# Patient Record
Sex: Male | Born: 1995 | Race: Black or African American | Hispanic: No | Marital: Single | Smoking: Never smoker
Health system: Southern US, Community
[De-identification: ages and names within clinical notes are randomized; demographics above are authoritative.]

---
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
## 2020-08-13 ENCOUNTER — Emergency Department (HOSPITAL_BASED_OUTPATIENT_CLINIC_OR_DEPARTMENT_OTHER)
Admission: EM | Admit: 2020-08-13 | Discharge: 2020-08-13 | Disposition: A | Discharge status: Home / Self Care | Payer: Medicaid Other | Attending: Emergency Medicine | Admitting: Emergency Medicine

## 2020-08-13 ENCOUNTER — Encounter (HOSPITAL_BASED_OUTPATIENT_CLINIC_OR_DEPARTMENT_OTHER): Payer: Self-pay | Admitting: *Deleted

## 2020-08-13 ENCOUNTER — Other Ambulatory Visit: Payer: Self-pay

## 2020-08-13 ENCOUNTER — Emergency Department (HOSPITAL_BASED_OUTPATIENT_CLINIC_OR_DEPARTMENT_OTHER): Payer: Medicaid Other

## 2020-08-13 DIAGNOSIS — R0989 Other specified symptoms and signs involving the circulatory and respiratory systems: Secondary | ICD-10-CM | POA: Insufficient documentation

## 2020-08-13 DIAGNOSIS — R0602 Shortness of breath: Secondary | ICD-10-CM | POA: Insufficient documentation

## 2020-08-13 LAB — CBC WITH DIFFERENTIAL/PLATELET
Abs Immature Granulocytes: 0.01 10*3/uL (ref 0.00–0.07)
Basophils Absolute: 0 10*3/uL (ref 0.0–0.1)
Basophils Relative: 0 %
Eosinophils Absolute: 0 10*3/uL (ref 0.0–0.5)
Eosinophils Relative: 0 %
HCT: 42.9 % (ref 39.0–52.0)
Hemoglobin: 14.8 g/dL (ref 13.0–17.0)
Immature Granulocytes: 0 %
Lymphocytes Relative: 18 %
Lymphs Abs: 1.5 10*3/uL (ref 0.7–4.0)
MCH: 32.7 pg (ref 26.0–34.0)
MCHC: 34.5 g/dL (ref 30.0–36.0)
MCV: 94.7 fL (ref 80.0–100.0)
Monocytes Absolute: 0.7 10*3/uL (ref 0.1–1.0)
Monocytes Relative: 8 %
Neutro Abs: 6.2 10*3/uL (ref 1.7–7.7)
Neutrophils Relative %: 74 %
Platelets: 282 10*3/uL (ref 150–400)
RBC: 4.53 MIL/uL (ref 4.22–5.81)
RDW: 12.2 % (ref 11.5–15.5)
WBC: 8.4 10*3/uL (ref 4.0–10.5)
nRBC: 0 % (ref 0.0–0.2)

## 2020-08-13 LAB — COMPREHENSIVE METABOLIC PANEL
ALT: 38 U/L (ref 0–44)
AST: 33 U/L (ref 15–41)
Albumin: 4.7 g/dL (ref 3.5–5.0)
Alkaline Phosphatase: 49 U/L (ref 38–126)
Anion gap: 13 (ref 5–15)
BUN: 10 mg/dL (ref 6–20)
CO2: 22 mmol/L (ref 22–32)
Calcium: 9.5 mg/dL (ref 8.9–10.3)
Chloride: 103 mmol/L (ref 98–111)
Creatinine, Ser: 0.93 mg/dL (ref 0.61–1.24)
GFR, Estimated: >60 mL/min (ref >60)
Glucose, Bld: 110 mg/dL — ABNORMAL HIGH (ref 70–99)
Potassium: 3.7 mmol/L (ref 3.5–5.1)
Sodium: 138 mmol/L (ref 135–145)
Total Bilirubin: 0.6 mg/dL (ref 0.3–1.2)
Total Protein: 8.8 g/dL — ABNORMAL HIGH (ref 6.5–8.1)

## 2020-08-13 MED ORDER — LORAZEPAM 1 MG PO TABS
1.0000 mg | ORAL_TABLET | Freq: Once | ORAL | Status: AC
  Administered 2020-08-13: 1 mg via ORAL
  Filled 2020-08-13: qty 1

## 2020-08-13 MED ORDER — FLUTICASONE PROPIONATE 50 MCG/ACT NA SUSP: 1.0000 | Freq: Every day | NASAL | 2 refills | Status: AC

## 2020-08-13 MED ORDER — HYDROXYZINE HCL 25 MG PO TABS: 25.0000 mg | ORAL_TABLET | Freq: Three times a day (TID) | ORAL | 0 refills | Status: AC | PRN

## 2020-08-13 MED ORDER — OXYMETAZOLINE HCL 0.05 % NA SOLN
1.0000 | Freq: Once | NASAL | Status: AC
  Administered 2020-08-13: 1 via NASAL
  Filled 2020-08-13: qty 30

## 2020-08-13 NOTE — ED Triage Notes (Signed)
C/o sudden SOB at night while sleeping x 3 weeks, seen at [facility] for same today , sent here for further eval , ekg done at [facility]

## 2020-08-13 NOTE — ED Provider Notes (Signed)
[facility] EMERGENCY DEPARTMENT Provider Note   CSN: [id number] Arrival date & time: 08/13/20  1817     History Chief Complaint  Patient presents with  . Shortness of Breath    Todd Frey is a 24 y.o. male.   Shortness of Breath Severity:  Moderate Onset quality:  Gradual Timing:  Constant Progression:  Worsening Chronicity:  New Context comment:  Awake from sleep in panic feeling heart pound Relieved by:  Nothing Exacerbated by: nasal congestion. Ineffective treatments:  None tried Associated symptoms: no chest pain, no cough, no fever, no headaches, no hemoptysis, no rash, no sputum production, no vomiting and no wheezing        History reviewed. No pertinent past medical history.  There are no problems to display for this patient.   History reviewed. No pertinent surgical history.     No family history on file.  Social History   Tobacco Use  . Smoking status: Never Smoker  Substance Use Topics  . Alcohol use: Not Currently  . Drug use: Yes    Types: Marijuana    Home Medications Prior to Admission medications   Medication Sig Start Date End Date Taking? Authorizing Provider  fluticasone (FLONASE) 50 MCG/ACT nasal spray Place 1 spray into both nostrils daily. 08/13/20   Sabino Donovan, MD  hydrOXYzine (ATARAX/VISTARIL) 25 MG tablet Take 1 tablet (25 mg total) by mouth every 8 (eight) hours as needed for anxiety. 08/13/20   Sabino Donovan, MD    Allergies    Patient has no known allergies.  Review of Systems   Review of Systems  Constitutional: Negative for chills and fever.  HENT: Positive for congestion. Negative for rhinorrhea.   Respiratory: Positive for shortness of breath. Negative for cough, hemoptysis, sputum production and wheezing.   Cardiovascular: Negative for chest pain and palpitations.  Gastrointestinal: Negative for diarrhea, nausea and vomiting.  Genitourinary: Negative for difficulty urinating and dysuria.    Musculoskeletal: Negative for arthralgias and back pain.  Skin: Negative for color change and rash.  Neurological: Negative for light-headedness and headaches.    Physical Exam Updated Vital Signs BP 135/77 (BP Location: Right Arm)   Pulse 88   Temp 98.4 F (36.9 C) (Oral)   Resp (!) 22   Ht 6\' 1"  (1.854 m)   Wt 118.8 kg   SpO2 100%   BMI 34.57 kg/m   Physical Exam Vitals and nursing note reviewed.  Constitutional:      General: He is not in acute distress.    Appearance: Normal appearance.  HENT:     Head: Normocephalic and atraumatic.     Nose: Mucosal edema and congestion present. No rhinorrhea.     Right Turbinates: Enlarged and swollen.     Left Turbinates: Enlarged and swollen.  Eyes:     General:        Right eye: No discharge.        Left eye: No discharge.     Conjunctiva/sclera: Conjunctivae normal.  Cardiovascular:     Rate and Rhythm: Normal rate and regular rhythm.     Heart sounds: No murmur heard.  No friction rub. No gallop.   Pulmonary:     Effort: Pulmonary effort is normal.     Breath sounds: No stridor.  Abdominal:     General: Abdomen is flat. There is no distension.     Palpations: Abdomen is soft.  Musculoskeletal:        General: No deformity or signs  of injury.  Skin:    General: Skin is warm and dry.  Neurological:     General: No focal deficit present.     Mental Status: He is alert. Mental status is at baseline.     Motor: No weakness.  Psychiatric:        Mood and Affect: Mood normal.        Behavior: Behavior normal.        Thought Content: Thought content normal.     ED Results / Procedures / Treatments   Labs (all labs ordered are listed, but only abnormal results are displayed) Labs Reviewed  COMPREHENSIVE METABOLIC PANEL - Abnormal; Notable for the following components:      Result Value   Glucose, Bld 110 (*)    Total Protein 8.8 (*)    All other components within normal limits  CBC WITH DIFFERENTIAL/PLATELET     EKG None  Radiology DG Chest 2 View  Result Date: 08/13/2020 CLINICAL DATA:  Shortness of breath. EXAM: CHEST - 2 VIEW COMPARISON:  No pertinent prior exams available for comparison. FINDINGS: Heart size within normal limits. No appreciable airspace consolidation. No evidence of pleural effusion or pneumothorax. No acute bony abnormality identified. IMPRESSION: No evidence of active cardiopulmonary disease. Electronically Signed   By: Jackey Loge DO   On: 08/13/2020 18:55    Procedures Procedures (including critical care time)  Medications Ordered in ED Medications  LORazepam (ATIVAN) tablet 1 mg (1 mg Oral Given 08/13/20 2142)  oxymetazoline (AFRIN) 0.05 % nasal spray 1 spray (1 spray Each Nare Given 08/13/20 2143)  LORazepam (ATIVAN) tablet 1 mg (1 mg Oral Given 08/13/20 2221)    ED Course  I have reviewed the triage vital signs and the nursing notes.  Pertinent labs & imaging results that were available during my care of the patient were reviewed by me and considered in my medical decision making (see chart for details).    MDM Rules/Calculators/A&P                         Anxiety and sleep disturbance  Patient describes waking up from sleep and panic, fearful to fall asleep.  Symptoms consistent with panic disorder, possibly sleep apnea.  He does have very large near kissing tonsils but has no sore throat.  Complains of difficulty breathing through his nose due to chronic years long congestion.  Is reluctant to use medications to help with this.  Said no recent fevers illness.  No recent cough.  No chest pain.  He is fearful for sleeping as he wakes up with difficulty breathing through his nose.  He is offered antihistamines, he is offered Afrin here and told to start taking Flonase.  He is very anxious about this he is tearful about his inability to sleep lately.  He is given Ativan and this helps with mild symptom control.  I feel he deals with a lot of stress and anxiety and  needs further work-up.  He is given information for the behavioral health urgent care.  Also told to fragment put follow-up with primary care as he may need a sleep study.  Laboratory studies were done in triage chest x-ray was done in triage, reviewed by radiology myself of the imaging is unremarkable for acute cardiopulmonary pathology.  And laboratory studies are unremarkable to contribute to this patient's care.  He is given strict return precautions and invited to come back anytime for reevaluation.  Sent home  with prescription for hydroxyzine and Flonase.  Final Clinical Impression(s) / ED Diagnoses Final diagnoses:  SOB (shortness of breath)    Rx / DC Orders ED Discharge Orders         Ordered    hydrOXYzine (ATARAX/VISTARIL) 25 MG tablet  Every 8 hours PRN        08/13/20 2212    fluticasone (FLONASE) 50 MCG/ACT nasal spray  Daily        08/13/20 2212           Sabino Donovan, MD 08/13/20 2316
